# Patient Record
Sex: Male | Born: 1975 | Race: White | Hispanic: No | Marital: Single | Smoking: Former smoker
Health system: Southern US, Community
[De-identification: ages and names within clinical notes are randomized; demographics above are authoritative.]

---

## 2002-06-12 ENCOUNTER — Emergency Department (HOSPITAL_COMMUNITY): Admission: EM | Admit: 2002-06-12 | Discharge: 2002-06-12 | Payer: Self-pay | Admitting: Emergency Medicine

## 2004-04-07 ENCOUNTER — Emergency Department (HOSPITAL_COMMUNITY): Admission: EM | Admit: 2004-04-07 | Discharge: 2004-04-08 | Payer: Self-pay | Admitting: Emergency Medicine

## 2004-10-15 ENCOUNTER — Observation Stay (HOSPITAL_COMMUNITY): Admission: EM | Admit: 2004-10-15 | Discharge: 2004-10-16 | Payer: Self-pay | Admitting: Internal Medicine

## 2004-10-15 ENCOUNTER — Ambulatory Visit: Payer: Self-pay | Admitting: Internal Medicine

## 2004-10-16 ENCOUNTER — Ambulatory Visit: Payer: Self-pay | Admitting: Internal Medicine

## 2007-08-19 ENCOUNTER — Emergency Department (HOSPITAL_COMMUNITY): Admission: EM | Admit: 2007-08-19 | Discharge: 2007-08-19 | Payer: Self-pay | Admitting: Family Medicine

## 2008-05-22 ENCOUNTER — Emergency Department (HOSPITAL_COMMUNITY): Admission: EM | Admit: 2008-05-22 | Discharge: 2008-05-22 | Payer: Self-pay | Admitting: Family Medicine

## 2010-11-22 NOTE — H&P (Signed)
NAMEGUTHRIE, [name] NO.:  [id number]   MEDICAL RECORD NO.:  [id number]          PATIENT TYPE:  OBV   LOCATION:  0352                         FACILITY:  [facility]   PHYSICIAN:  Titus Dubin. Alwyn Ren, M.D. [facility] OF BIRTH:  1975/07/09   DATE OF ADMISSION:  10/15/2004  DATE OF DISCHARGE:                                HISTORY & PHYSICAL   HISTORY:  Mr. Jared Scott is a 35 year old white male who came to the office  emergently October 15, 2004 complaining of nausea, vomiting, and diarrhea. He  stated that he had had a head cold for 2 days. Significantly, he just  completed a 10-day course of cephalexin for a wound he had sustained at work  on September 20, 2004. He had been off antibiotics for 2 days when he began to  have the head congestion symptoms. This was associated with some yellow  discharge.   He awoke approximately 1 a.m. the morning of the office visit with nausea  and vomiting countless times. This was followed by frankly watery stool  which was green and mucous and smelled bad. He had taken Tylenol with  suboptimal response.   Significantly, his 9-and-a-half-month-old daughter has had nausea, vomiting,  diarrhea, and fever for 3 days. She has been seen by her pediatrician but is  on no medications.   His water source is city. He has had no significant travel history and has  no sick pets.   His wife and mother are also having some similar symptoms.   His past medical history reveals he has never been hospitalized. He has no  known allergies.   He smokes a pack a day. He is physically active at work.   Mother and aunt have diabetes. Mother has coronary artery disease as did his  father and grandfather. There is no family history of gastrointestinal  problems.   Review of systems also includes some chills. He has had no cough or sputum  production and no genitourinary symptoms.   PHYSICAL EXAMINATION:  GENERAL:  He appeared acutely ill, rocking back and  forth in the exam room.  VITAL SIGNS:  Weight was 182, temperature 97.7, pulse 104 and respiratory  rate 19. Blood pressure was 100/80.  HEENT:  Extraocular motion was full. Pupils were equal, round, and reactive  to light. He had no icterus. He had gray material in the oropharynx. There  was erythema of the oropharynx. The remainder of the otolaryngologic exam  was unremarkable. Neck was supple. Tongue was moist.  LUNGS:  He had minimal rales at the bases.  HEART:  He exhibited a tachycardia with a flow murmur.  ABDOMEN:  Nontender. He had no lymphadenopathy in the axilla but had shotty  lymph nodes in the right posterior cervical chain. He had no organomegaly.  SKIN:  Warm to touch. There was no jaundice. There was minimal erythema  around the well-healed eschar of the left hand. There was erythema over the  lumbosacral spine related to heat packs he was using in the exam room.   He was admitted with nausea and vomiting with  a similar illness in family  members suggesting possible Rotavirus which is rampant in the community. Of  concern is the fact that he just completed antibiotics for 10 days and could  have C. difficile colitis.   He will be covered with Flagyl following collection of stools. The Flagyl  should provide adequate coverage for rhinosinusitis as well.   He will be placed in a private room.      [facility]/MEDQ  D:  10/16/2004  T:  10/16/2004  Job:  [id number]

## 2011-06-14 ENCOUNTER — Ambulatory Visit (INDEPENDENT_AMBULATORY_CARE_PROVIDER_SITE_OTHER): Payer: BC Managed Care – PPO

## 2011-06-14 DIAGNOSIS — R059 Cough, unspecified: Secondary | ICD-10-CM

## 2011-06-14 DIAGNOSIS — J029 Acute pharyngitis, unspecified: Secondary | ICD-10-CM

## 2011-06-14 DIAGNOSIS — R05 Cough: Secondary | ICD-10-CM

## 2011-06-26 ENCOUNTER — Ambulatory Visit (INDEPENDENT_AMBULATORY_CARE_PROVIDER_SITE_OTHER): Payer: BC Managed Care – PPO

## 2011-06-26 DIAGNOSIS — R05 Cough: Secondary | ICD-10-CM

## 2011-06-26 DIAGNOSIS — J158 Pneumonia due to other specified bacteria: Secondary | ICD-10-CM

## 2014-10-25 ENCOUNTER — Emergency Department (HOSPITAL_COMMUNITY): Payer: BLUE CROSS/BLUE SHIELD

## 2014-10-25 ENCOUNTER — Encounter (HOSPITAL_COMMUNITY): Payer: Self-pay | Admitting: Emergency Medicine

## 2014-10-25 ENCOUNTER — Emergency Department (HOSPITAL_COMMUNITY)
Admission: EM | Admit: 2014-10-25 | Discharge: 2014-10-25 | Disposition: A | Discharge status: Home / Self Care | Payer: BLUE CROSS/BLUE SHIELD | Attending: Emergency Medicine | Admitting: Emergency Medicine

## 2014-10-25 DIAGNOSIS — R1030 Lower abdominal pain, unspecified: Secondary | ICD-10-CM | POA: Diagnosis present

## 2014-10-25 DIAGNOSIS — Z87891 Personal history of nicotine dependence: Secondary | ICD-10-CM | POA: Insufficient documentation

## 2014-10-25 DIAGNOSIS — N201 Calculus of ureter: Secondary | ICD-10-CM | POA: Diagnosis not present

## 2014-10-25 DIAGNOSIS — N23 Unspecified renal colic: Secondary | ICD-10-CM

## 2014-10-25 LAB — CBC WITH DIFFERENTIAL/PLATELET
BASOS ABS: 0.1 10*3/uL (ref 0.0–0.1)
Basophils Relative: 1 % (ref 0–1)
Eosinophils Absolute: 0.1 10*3/uL (ref 0.0–0.7)
Eosinophils Relative: 1 % (ref 0–5)
HCT: 48 % (ref 39.0–52.0)
HEMOGLOBIN: 17 g/dL (ref 13.0–17.0)
Lymphocytes Relative: 40 % (ref 12–46)
Lymphs Abs: 4 10*3/uL (ref 0.7–4.0)
MCH: 30.9 pg (ref 26.0–34.0)
MCHC: 35.4 g/dL (ref 30.0–36.0)
MCV: 87.1 fL (ref 78.0–100.0)
MONOS PCT: 7 % (ref 3–12)
Monocytes Absolute: 0.7 10*3/uL (ref 0.1–1.0)
NEUTROS ABS: 5.2 10*3/uL (ref 1.7–7.7)
Neutrophils Relative %: 51 % (ref 43–77)
Platelets: 309 10*3/uL (ref 150–400)
RBC: 5.51 MIL/uL (ref 4.22–5.81)
RDW: 12.9 % (ref 11.5–15.5)
WBC: 10.1 10*3/uL (ref 4.0–10.5)

## 2014-10-25 LAB — URINE MICROSCOPIC-ADD ON

## 2014-10-25 LAB — COMPREHENSIVE METABOLIC PANEL
ALT: 32 U/L (ref 0–53)
AST: 28 U/L (ref 0–37)
Albumin: 4.2 g/dL (ref 3.5–5.2)
Alkaline Phosphatase: 86 U/L (ref 39–117)
Anion gap: 12 (ref 5–15)
BUN: 17 mg/dL (ref 6–23)
CALCIUM: 9.4 mg/dL (ref 8.4–10.5)
CO2: 20 mmol/L (ref 19–32)
CREATININE: 1.43 mg/dL — AB (ref 0.50–1.35)
Chloride: 107 mmol/L (ref 96–112)
GFR calc non Af Amer: 60 mL/min — ABNORMAL LOW (ref >90)
GFR, EST AFRICAN AMERICAN: 70 mL/min — AB (ref >90)
GLUCOSE: 156 mg/dL — AB (ref 70–99)
Potassium: 3.5 mmol/L (ref 3.5–5.1)
SODIUM: 139 mmol/L (ref 135–145)
TOTAL PROTEIN: 7 g/dL (ref 6.0–8.3)
Total Bilirubin: 0.7 mg/dL (ref 0.3–1.2)

## 2014-10-25 LAB — URINALYSIS, ROUTINE W REFLEX MICROSCOPIC
Bilirubin Urine: NEGATIVE
GLUCOSE, UA: NEGATIVE mg/dL
Ketones, ur: 15 mg/dL — AB
LEUKOCYTES UA: NEGATIVE
Nitrite: NEGATIVE
PH: 6.5 (ref 5.0–8.0)
Protein, ur: 30 mg/dL — AB
SPECIFIC GRAVITY, URINE: 1.03 (ref 1.005–1.030)
Urobilinogen, UA: 1 mg/dL (ref 0.0–1.0)

## 2014-10-25 MED ORDER — HYDROMORPHONE HCL 1 MG/ML IJ SOLN
1.0000 mg | Freq: Once | INTRAMUSCULAR | Status: AC
  Administered 2014-10-25: 1 mg via INTRAVENOUS
  Filled 2014-10-25: qty 1

## 2014-10-25 MED ORDER — KETOROLAC TROMETHAMINE 30 MG/ML IJ SOLN
30.0000 mg | Freq: Once | INTRAMUSCULAR | Status: AC
  Administered 2014-10-25: 30 mg via INTRAVENOUS
  Filled 2014-10-25: qty 1

## 2014-10-25 MED ORDER — OXYCODONE-ACETAMINOPHEN 5-325 MG PO TABS: 1.0000 | ORAL_TABLET | ORAL | Status: AC | PRN

## 2014-10-25 MED ORDER — TAMSULOSIN HCL 0.4 MG PO CAPS: ORAL_CAPSULE | ORAL | Status: AC

## 2014-10-25 MED ORDER — MORPHINE SULFATE 4 MG/ML IJ SOLN
4.0000 mg | Freq: Once | INTRAMUSCULAR | Status: AC
  Administered 2014-10-25: 4 mg via INTRAVENOUS
  Filled 2014-10-25: qty 1

## 2014-10-25 MED ORDER — ONDANSETRON HCL 4 MG/2ML IJ SOLN
4.0000 mg | Freq: Once | INTRAMUSCULAR | Status: AC
  Administered 2014-10-25: 4 mg via INTRAVENOUS
  Filled 2014-10-25: qty 2

## 2014-10-25 NOTE — ED Notes (Signed)
Pt reports onset of left sided flank pain at 0500 this AM. Pt is unable to sit still or get comfortable.

## 2014-10-25 NOTE — Discharge Instructions (Signed)
Take the pain medicine regularly, until you see the stone. Straining the urine, to catch the stone. Save the stone and take it to the urologist. Do not drive or work when taking the pain medication.    Kidney Stones Kidney stones (urolithiasis) are deposits that form inside your kidneys. The intense pain is caused by the stone moving through the urinary tract. When the stone moves, the ureter goes into spasm around the stone. The stone is usually passed in the urine.  CAUSES   A disorder that makes certain neck glands produce too much parathyroid hormone (primary hyperparathyroidism).  A buildup of uric acid crystals, similar to gout in your joints.  Narrowing (stricture) of the ureter.  A kidney obstruction present at birth (congenital obstruction).  Previous surgery on the kidney or ureters.  Numerous kidney infections. SYMPTOMS   Feeling sick to your stomach (nauseous).  Throwing up (vomiting).  Blood in the urine (hematuria).  Pain that usually spreads (radiates) to the groin.  Frequency or urgency of urination. DIAGNOSIS   Taking a history and physical exam.  Blood or urine tests.  CT scan.  Occasionally, an examination of the inside of the urinary bladder (cystoscopy) is performed. TREATMENT   Observation.  Increasing your fluid intake.  Extracorporeal shock wave lithotripsy--This is a noninvasive procedure that uses shock waves to break up kidney stones.  Surgery may be needed if you have severe pain or persistent obstruction. There are various surgical procedures. Most of the procedures are performed with the use of small instruments. Only small incisions are needed to accommodate these instruments, so recovery time is minimized. The size, location, and chemical composition are all important variables that will determine the proper choice of action for you. Talk to your health care provider to better understand your situation so that you will minimize the  risk of injury to yourself and your kidney.  HOME CARE INSTRUCTIONS   Drink enough water and fluids to keep your urine clear or pale yellow. This will help you to pass the stone or stone fragments.  Strain all urine through the provided strainer. Keep all particulate matter and stones for your health care provider to see. The stone causing the pain may be as small as a grain of salt. It is very important to use the strainer each and every time you pass your urine. The collection of your stone will allow your health care provider to analyze it and verify that a stone has actually passed. The stone analysis will often identify what you can do to reduce the incidence of recurrences.  Only take over-the-counter or prescription medicines for pain, discomfort, or fever as directed by your health care provider.  Make a follow-up appointment with your health care provider as directed.  Get follow-up X-rays if required. The absence of pain does not always mean that the stone has passed. It may have only stopped moving. If the urine remains completely obstructed, it can cause loss of kidney function or even complete destruction of the kidney. It is your responsibility to make sure X-rays and follow-ups are completed. Ultrasounds of the kidney can show blockages and the status of the kidney. Ultrasounds are not associated with any radiation and can be performed easily in a matter of minutes. SEEK MEDICAL CARE IF:  You experience pain that is progressive and unresponsive to any pain medicine you have been prescribed. SEEK IMMEDIATE MEDICAL CARE IF:   Pain cannot be controlled with the prescribed medicine.  You have  a fever or shaking chills.  The severity or intensity of pain increases over 18 hours and is not relieved by pain medicine.  You develop a new onset of abdominal pain.  You feel faint or pass out.  You are unable to urinate. MAKE SURE YOU:   Understand these instructions.  Will watch  your condition.  Will get help right away if you are not doing well or get worse. Document Released: 06/23/2005 Document Revised: 02/23/2013 Document Reviewed: 11/24/2012 [facility] Patient Information 2015 [place], [place]. This information is not intended to replace advice given to you by your health care provider. Make sure you discuss any questions you have with your health care provider.

## 2014-10-25 NOTE — ED Provider Notes (Signed)
CSN: [id number]     Arrival date & time 10/25/14  0645 History   First MD Initiated Contact with Patient 10/25/14 [phone]     Chief Complaint  Patient presents with  . Flank Pain     (Consider location/radiation/quality/duration/timing/severity/associated sxs/prior Treatment) HPI   Jared Scott is a 39 y.o. male who reports onset of sharp left flank pain, today, will driving to work. He has had nausea without vomiting. He has had urinary frequency without dysuria or hematuria. His never had this. The pain was initially severe but now it is "dull" and more tolerable. He has never had this previously. There've been no other recent illnesses. No personal or family history of kidney stones. There are no other known modifying factors.   History reviewed. No pertinent past medical history. History reviewed. No pertinent past surgical history. History reviewed. No pertinent family history. History  Substance Use Topics  . Smoking status: Former Games developer  . Smokeless tobacco: Not on file  . Alcohol Use: No    Review of Systems  All other systems reviewed and are negative.     Allergies  Review of patient's allergies indicates no known allergies.  Home Medications   Prior to Admission medications   Not on File   BP 135/94 mmHg  Pulse 83  Temp(Src) 97.7 F (36.5 C) (Temporal)  Resp 17  SpO2 95% Physical Exam  Constitutional: He is oriented to person, place, and time. He appears well-developed and well-nourished. He appears distressed (he is uncomfortable).  HENT:  Head: Normocephalic and atraumatic.  Right Ear: External ear normal.  Left Ear: External ear normal.  Eyes: Conjunctivae and EOM are normal. Pupils are equal, round, and reactive to light.  Neck: Normal range of motion and phonation normal. Neck supple.  Cardiovascular: Normal rate, regular rhythm and normal heart sounds.   Pulmonary/Chest: Effort normal and breath sounds normal. He exhibits no tenderness and no bony  tenderness.  Abdominal: Soft. He exhibits no mass. There is no tenderness. There is no guarding.  Genitourinary:  No costovertebral angle tenderness to percussion.  Musculoskeletal: Normal range of motion.  No tenderness of the lumbar region.  Neurological: He is alert and oriented to person, place, and time. No cranial nerve deficit or sensory deficit. He exhibits normal muscle tone. Coordination normal.  Skin: Skin is warm, dry and intact.  Psychiatric: He has a normal mood and affect. His behavior is normal. Judgment and thought content normal.  Nursing note and vitals reviewed.   ED Course  Procedures (including critical care time)  Medications  morphine 4 MG/ML injection 4 mg (4 mg Intravenous Given 10/25/14 0714)  ketorolac (TORADOL) 30 MG/ML injection 30 mg (30 mg Intravenous Given 10/25/14 0719)  ondansetron (ZOFRAN) injection 4 mg (4 mg Intravenous Given 10/25/14 0707)  HYDROmorphone (DILAUDID) injection 1 mg (1 mg Intravenous Given 10/25/14 0834)    Patient Vitals for the past 24 hrs:  BP Temp Temp src Pulse Resp SpO2  10/25/14 0845 135/94 mmHg - - 83 17 95 %  10/25/14 0722 - 97.7 F (36.5 C) Temporal - - -  10/25/14 0652 - - - (!) 133 - 100 %  10/25/14 0651 (!) 153/101 mmHg - - - - -    8:31 AM Reevaluation with update and discussion. After initial assessment and treatment, an updated evaluation reveals he had transient relief of the pain, it has returned 15 minutes ago. He is pacing the floor. Additional medication ordered. Jared Scott   9:49 AM  Reevaluation with update and discussion. After initial assessment and treatment, an updated evaluation reveals his pain is "zero". Findings discussed with patient, all questions answered. Jared Scott   Labs Review Labs Reviewed  COMPREHENSIVE METABOLIC PANEL - Abnormal; Notable for the following:    Glucose, Bld 156 (*)    Creatinine, Ser 1.43 (*)    GFR calc non Af Amer 60 (*)    GFR calc Af Amer 70 (*)    All other  components within normal limits  URINALYSIS, ROUTINE W REFLEX MICROSCOPIC - Abnormal; Notable for the following:    Color, Urine AMBER (*)    APPearance CLOUDY (*)    Hgb urine dipstick LARGE (*)    Ketones, ur 15 (*)    Protein, ur 30 (*)    All other components within normal limits  CBC WITH DIFFERENTIAL/PLATELET  URINE MICROSCOPIC-ADD ON    Imaging Review Ct Renal Stone Study  10/25/2014   CLINICAL DATA:  39 year old male with left flank pain nausea and vomiting. Initial encounter.  EXAM: CT ABDOMEN AND PELVIS WITHOUT CONTRAST  TECHNIQUE: Multidetector CT imaging of the abdomen and pelvis was performed following the standard protocol without IV contrast.  COMPARISON:  None.  FINDINGS: Negative lung bases.  No pericardial or pleural effusion.  No osseous abnormality identified.  No pelvic free fluid.  Decompressed distal colon.  Decompressed left colon and splenic flexure. Largely decompressed transverse colon. Negative right colon and appendix. Negative terminal ileum. No dilated small bowel. Decompressed stomach and duodenum.  Noncontrast liver, gallbladder, spleen, pancreas, and adrenal glands are within normal limits. No abdominal free fluid.  Negative non contrast right kidney. Negative course of the right ureter. Diminutive bladder.  Mild asymmetric enlargement of the left renal collecting system. Punctate intra renal calculi in the left renal upper pole and lower pole best seen on coronal images. Mild left periureteral stranding which increases towards the pelvis. In the left hemipelvis there is a distal 4 x 4 mm calculus located 5-10 mm from the left ureterovesical junction.  IMPRESSION: 1. Acute obstructive uropathy on the left with a 4 mm calculus located 5-10 mm from the left UVJ. 2. Other punctate left nephrolithiasis.   Electronically Signed   By: Odessa FlemingH  Hall M.D.   On: 10/25/2014 07:57     EKG Interpretation None      MDM   Final diagnoses:  Ureteral colic  Ureteral stone     Ureteral colic with relatively small kidney stone. Doubt UTI, urosepsis, or hemodynamic instability.  Nursing Notes Reviewed/ Care Coordinated Applicable Imaging Reviewed Interpretation of Laboratory Data incorporated into ED treatment  The patient appears reasonably screened and/or stabilized for discharge and I doubt any other medical condition or other [facility] requiring further screening, evaluation, or treatment in the ED at this time prior to discharge.  Plan: Home Medications- Percocet, Flomax; Home Treatments- strain urine; return here if the recommended treatment, does not improve the symptoms; Recommended follow up- Urology 1 week     Mancel BaleElliott Jelisa Hubbard, MD 10/25/14 [phone]

## 2016-11-26 IMAGING — CT CT RENAL STONE PROTOCOL
2 of 4 series · 17 of 46 positions shown, 19 images · non-contrast
Comparison: None.

CLINICAL DATA: 38-year-old male with left flank pain nausea and
vomiting. Initial encounter.

EXAM:
CT ABDOMEN AND PELVIS WITHOUT CONTRAST
TECHNIQUE: Multidetector CT imaging of the abdomen and pelvis was performed
following the standard protocol without IV contrast.

[Series 2: stone study 5.0 i30f 1 · axial · 0.77mm/px · z∈[-610,-195]mm · 14 of 91 slices shown, 16 images]
[im 4/91  soft-tissue]
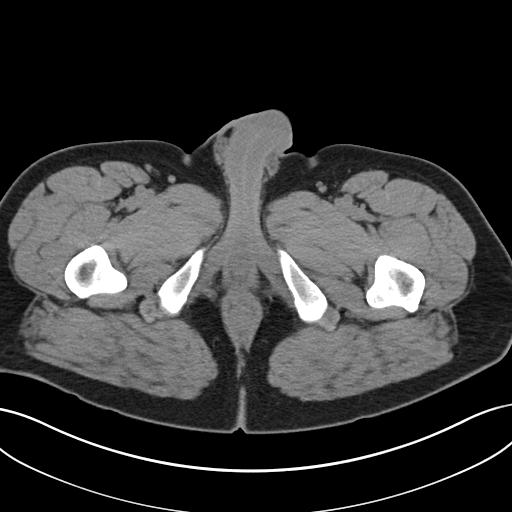
[im 4/91  bone]
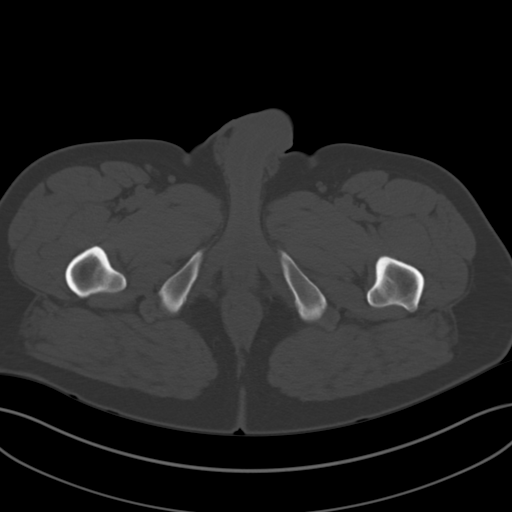
[im 11/91  soft-tissue]
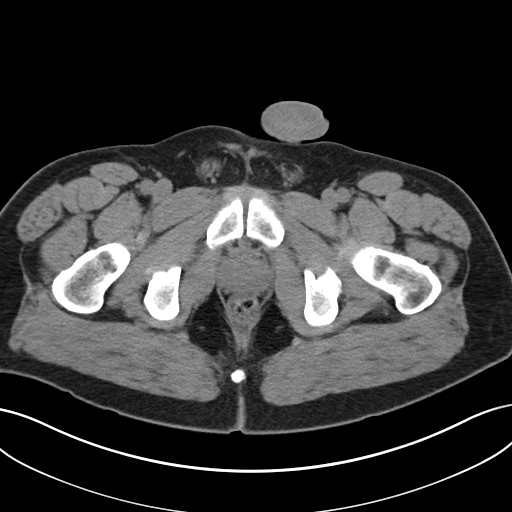
[im 19/91  soft-tissue]
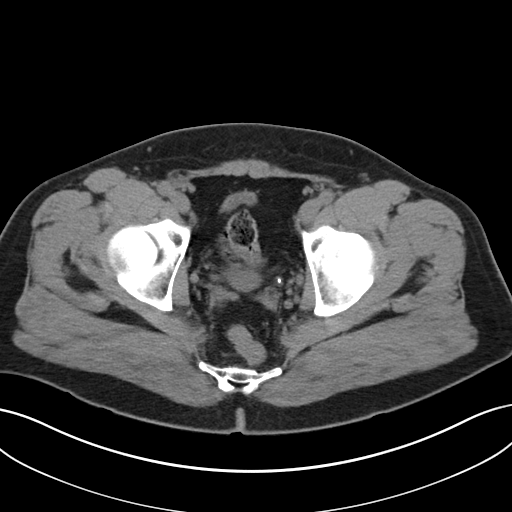
[im 26/91  soft-tissue]
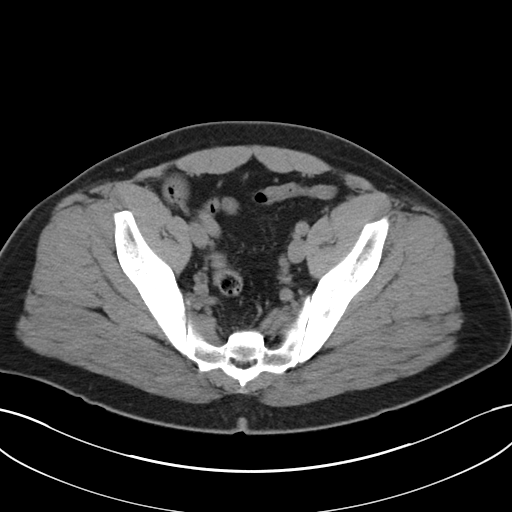
[im 29/91  soft-tissue]
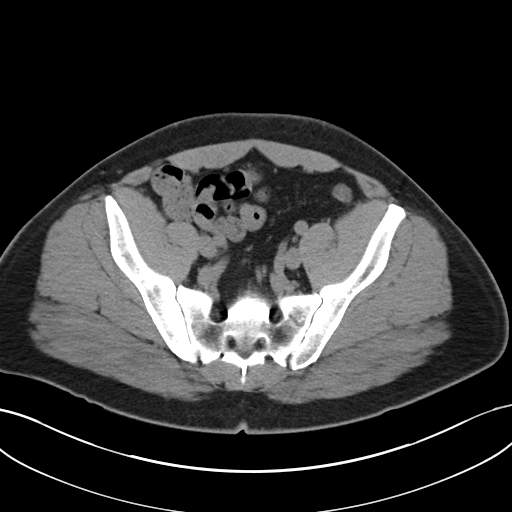
[im 37/91  soft-tissue]
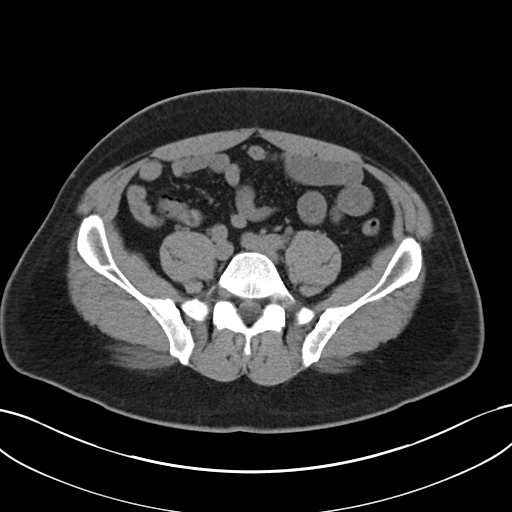
[im 44/91  soft-tissue]
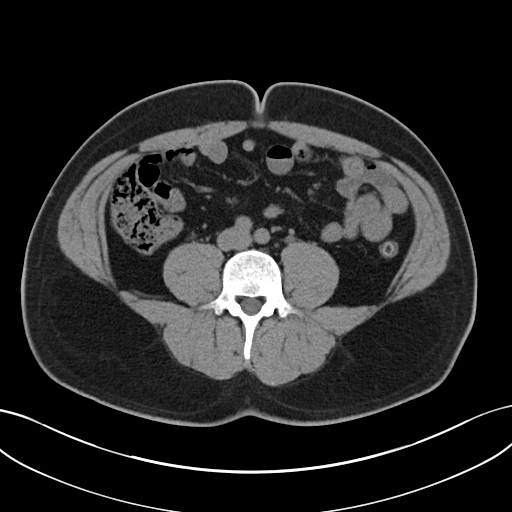
[im 47/91  soft-tissue]
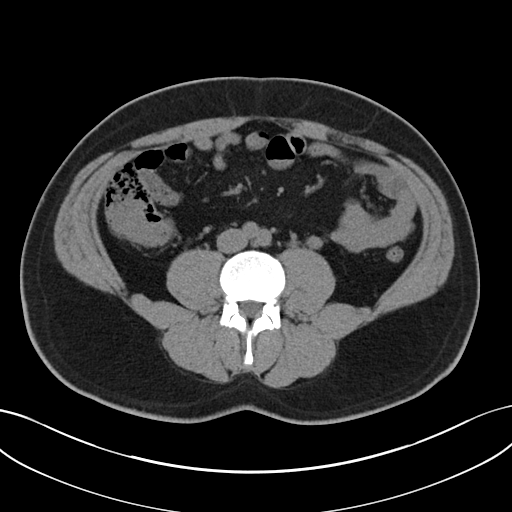
[im 55/91  soft-tissue]
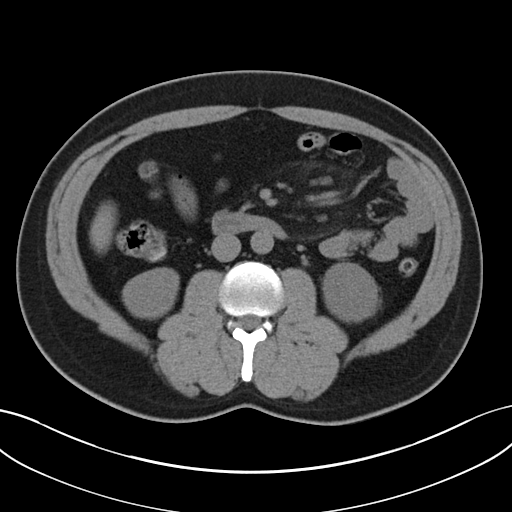
[im 55/91  bone]
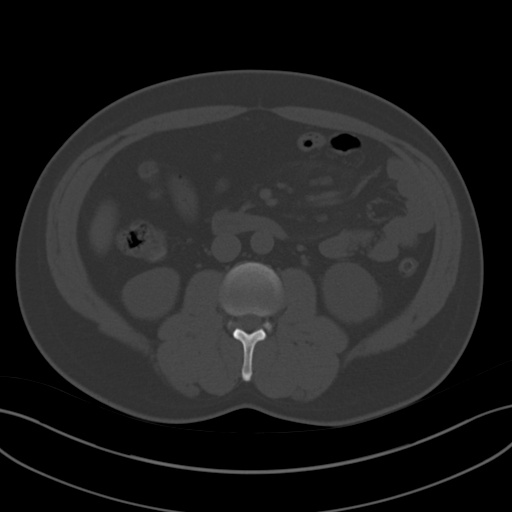
[im 62/91  soft-tissue]
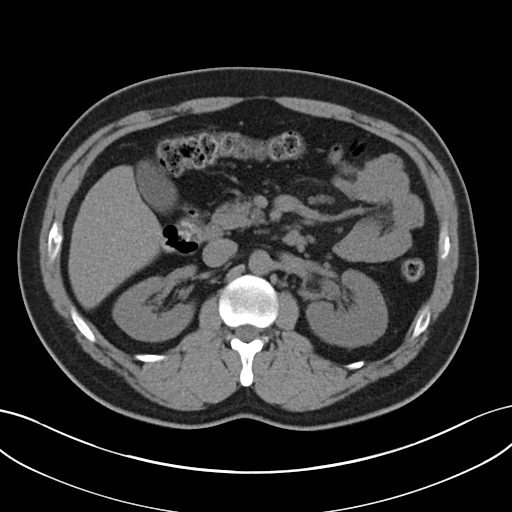
[im 69/91  soft-tissue]
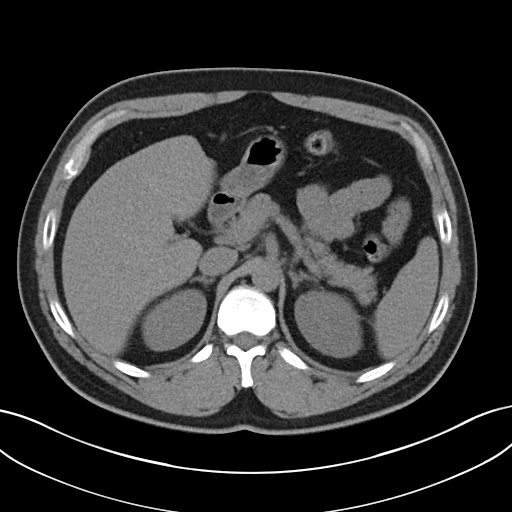
[im 73/91  soft-tissue]
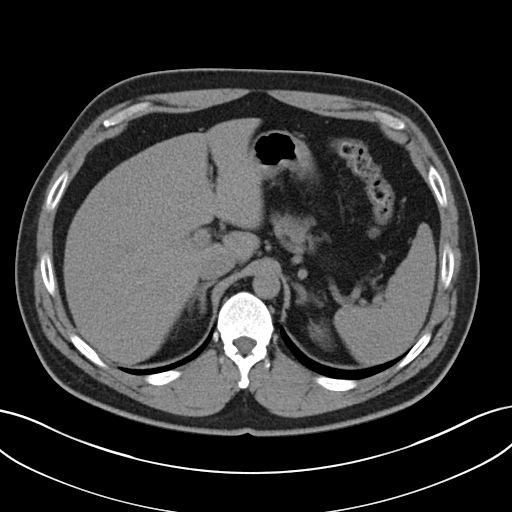
[im 80/91  soft-tissue]
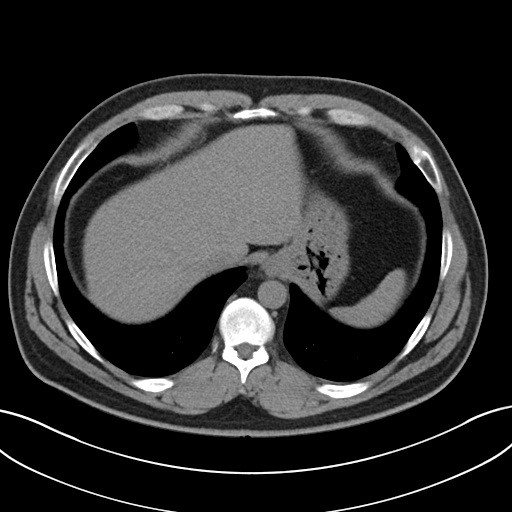
[im 87/91  soft-tissue]
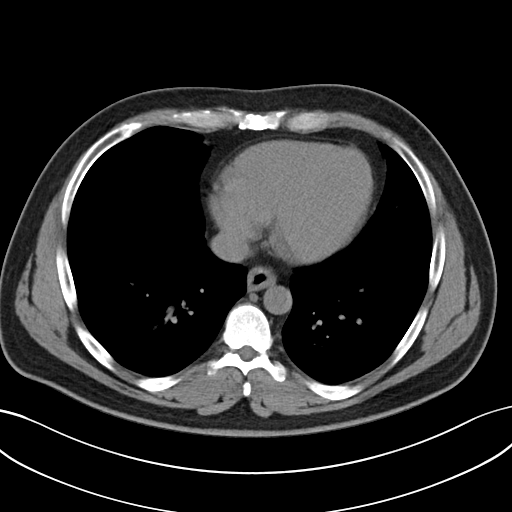

[Series 5: coronal soft tissue · coronal · 0.99mm/px · 3 of 101 slices shown]
[im 34/101  soft-tissue]
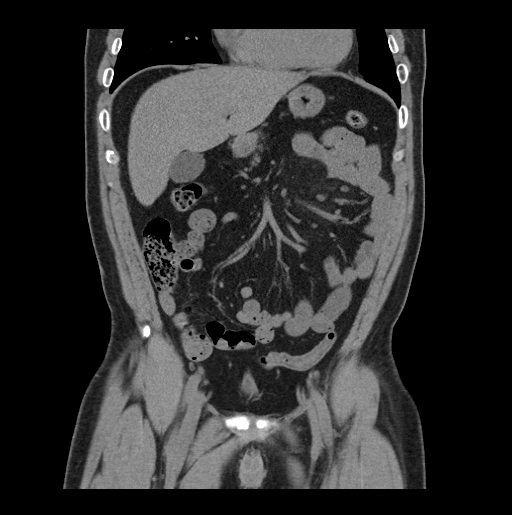
[im 45/101  soft-tissue]
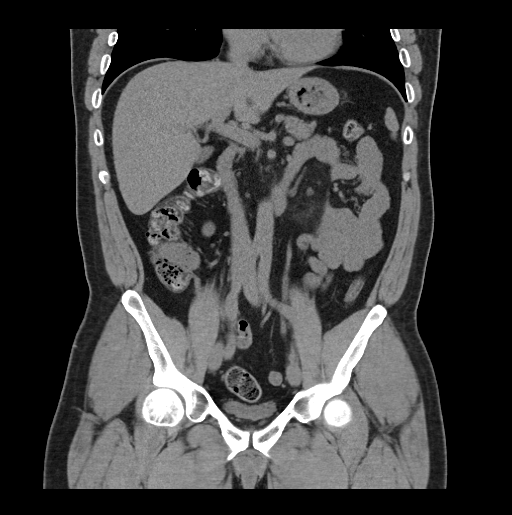
[im 56/101  soft-tissue]
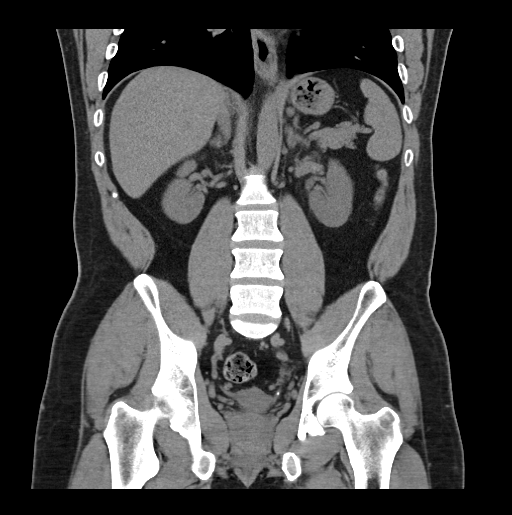

[17 of 46 positions shown; findings below may reference images not displayed]

FINDINGS: Negative lung bases.  No pericardial or pleural effusion.

No osseous abnormality identified.

No pelvic free fluid.  Decompressed distal colon.

Decompressed left colon and splenic flexure. Largely decompressed
transverse colon. Negative right colon and appendix. Negative
terminal ileum. No dilated small bowel. Decompressed stomach and
duodenum.

Noncontrast liver, gallbladder, spleen, pancreas, and adrenal glands
are within normal limits. No abdominal free fluid.

Negative non contrast right kidney. Negative course of the right
ureter. Diminutive bladder.

Mild asymmetric enlargement of the left renal collecting system.
Punctate intra renal calculi in the left renal upper pole and lower
pole best seen on coronal images. Mild left periureteral stranding
which increases towards the pelvis. In the left hemipelvis there is
a distal 4 x 4 mm calculus located 5-10 mm from the left
ureterovesical junction.
IMPRESSION: 1. Acute obstructive uropathy on the left with a 4 mm calculus
located 5-10 mm from the left UVJ.
2. Other punctate left nephrolithiasis.
